# Patient Record
Sex: Male | Born: 1995 | Race: Black or African American | Hispanic: No | Marital: Single | Smoking: Current every day smoker
Health system: Southern US, Community
[De-identification: ages and names within clinical notes are randomized; demographics above are authoritative.]

## PROBLEM LIST (undated history)

## (undated) DIAGNOSIS — F329 Major depressive disorder, single episode, unspecified: Secondary | ICD-10-CM

## (undated) DIAGNOSIS — F32A Depression, unspecified: Secondary | ICD-10-CM

## (undated) HISTORY — PX: CLAVICLE SURGERY: SHX598

---
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
## 2014-07-26 ENCOUNTER — Emergency Department (HOSPITAL_COMMUNITY)
Admission: EM | Admit: 2014-07-26 | Discharge: 2014-07-27 | Disposition: A | Discharge status: Home / Self Care | Payer: Self-pay | Attending: Emergency Medicine | Admitting: Emergency Medicine

## 2014-07-26 ENCOUNTER — Encounter (HOSPITAL_COMMUNITY): Payer: Self-pay

## 2014-07-26 DIAGNOSIS — S60417A Abrasion of left little finger, initial encounter: Secondary | ICD-10-CM | POA: Insufficient documentation

## 2014-07-26 DIAGNOSIS — S0990XA Unspecified injury of head, initial encounter: Secondary | ICD-10-CM | POA: Insufficient documentation

## 2014-07-26 DIAGNOSIS — Y998 Other external cause status: Secondary | ICD-10-CM | POA: Insufficient documentation

## 2014-07-26 DIAGNOSIS — Z792 Long term (current) use of antibiotics: Secondary | ICD-10-CM | POA: Insufficient documentation

## 2014-07-26 DIAGNOSIS — Z880 Allergy status to penicillin: Secondary | ICD-10-CM | POA: Insufficient documentation

## 2014-07-26 DIAGNOSIS — Y9389 Activity, other specified: Secondary | ICD-10-CM | POA: Insufficient documentation

## 2014-07-26 DIAGNOSIS — Y92410 Unspecified street and highway as the place of occurrence of the external cause: Secondary | ICD-10-CM | POA: Insufficient documentation

## 2014-07-26 DIAGNOSIS — S3992XA Unspecified injury of lower back, initial encounter: Secondary | ICD-10-CM | POA: Insufficient documentation

## 2014-07-26 DIAGNOSIS — S301XXA Contusion of abdominal wall, initial encounter: Secondary | ICD-10-CM | POA: Insufficient documentation

## 2014-07-26 DIAGNOSIS — S20319A Abrasion of unspecified front wall of thorax, initial encounter: Secondary | ICD-10-CM | POA: Insufficient documentation

## 2014-07-26 DIAGNOSIS — Z720 Tobacco use: Secondary | ICD-10-CM | POA: Insufficient documentation

## 2014-07-26 DIAGNOSIS — Z79899 Other long term (current) drug therapy: Secondary | ICD-10-CM | POA: Insufficient documentation
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
## 2014-07-27 ENCOUNTER — Encounter (HOSPITAL_COMMUNITY): Payer: Self-pay | Admitting: Radiology

## 2014-07-27 ENCOUNTER — Emergency Department (HOSPITAL_COMMUNITY): Payer: Self-pay

## 2014-07-27 ENCOUNTER — Emergency Department (HOSPITAL_COMMUNITY): Payer: Medicaid Other

## 2014-07-27 LAB — CBC WITH DIFFERENTIAL/PLATELET
BASOS PCT: 0 % (ref 0–1)
Basophils Absolute: 0 10*3/uL (ref 0.0–0.1)
EOS ABS: 0.2 10*3/uL (ref 0.0–0.7)
EOS PCT: 3 % (ref 0–5)
HCT: 42.3 % (ref 39.0–52.0)
Hemoglobin: 14.4 g/dL (ref 13.0–17.0)
Lymphocytes Relative: 44 % (ref 12–46)
Lymphs Abs: 2.7 10*3/uL (ref 0.7–4.0)
MCH: 30.3 pg (ref 26.0–34.0)
MCHC: 34 g/dL (ref 30.0–36.0)
MCV: 89.1 fL (ref 78.0–100.0)
MONO ABS: 0.5 10*3/uL (ref 0.1–1.0)
Monocytes Relative: 8 % (ref 3–12)
Neutro Abs: 2.8 10*3/uL (ref 1.7–7.7)
Neutrophils Relative %: 45 % (ref 43–77)
PLATELETS: 190 10*3/uL (ref 150–400)
RBC: 4.75 MIL/uL (ref 4.22–5.81)
RDW: 13.5 % (ref 11.5–15.5)
WBC: 6.1 10*3/uL (ref 4.0–10.5)

## 2014-07-27 LAB — COMPREHENSIVE METABOLIC PANEL
ALBUMIN: 4 g/dL (ref 3.5–5.2)
ALT: 20 U/L (ref 0–53)
ANION GAP: 7 (ref 5–15)
AST: 23 U/L (ref 0–37)
Alkaline Phosphatase: 71 U/L (ref 39–117)
BUN: 17 mg/dL (ref 6–23)
CHLORIDE: 104 mmol/L (ref 96–112)
CO2: 27 mmol/L (ref 19–32)
CREATININE: 1.37 mg/dL — AB (ref 0.50–1.35)
Calcium: 9 mg/dL (ref 8.4–10.5)
GFR, EST AFRICAN AMERICAN: 85 mL/min — AB (ref >90)
GFR, EST NON AFRICAN AMERICAN: 73 mL/min — AB (ref >90)
Glucose, Bld: 97 mg/dL (ref 70–99)
POTASSIUM: 3.8 mmol/L (ref 3.5–5.1)
Sodium: 138 mmol/L (ref 135–145)
TOTAL PROTEIN: 6.2 g/dL (ref 6.0–8.3)
Total Bilirubin: 1.3 mg/dL — ABNORMAL HIGH (ref 0.3–1.2)

## 2014-07-27 LAB — CK: CK TOTAL: 511 U/L — AB (ref 7–232)

## 2014-07-27 LAB — LIPASE, BLOOD: Lipase: 36 U/L (ref 11–59)

## 2014-07-27 LAB — I-STAT CREATININE, ED: CREATININE: 1.4 mg/dL — AB (ref 0.50–1.35)

## 2014-07-27 LAB — TROPONIN I: Troponin I: <0.03 ng/mL (ref <0.031)

## 2014-07-27 MED ORDER — SODIUM CHLORIDE 0.9 % IV BOLUS (SEPSIS)
1000.0000 mL | Freq: Once | INTRAVENOUS | Status: AC
  Administered 2014-07-27: 1000 mL via INTRAVENOUS

## 2014-07-27 MED ORDER — NAPROXEN 500 MG PO TABS: ORAL_TABLET | ORAL | Status: AC

## 2014-07-27 MED ORDER — FENTANYL CITRATE 0.05 MG/ML IJ SOLN
50.0000 ug | Freq: Once | INTRAMUSCULAR | Status: AC
  Administered 2014-07-27: 50 ug via INTRAVENOUS
  Filled 2014-07-27: qty 2

## 2014-07-27 MED ORDER — IOHEXOL 300 MG/ML  SOLN
100.0000 mL | Freq: Once | INTRAMUSCULAR | Status: AC | PRN
  Administered 2014-07-27: 100 mL via INTRAVENOUS

## 2014-07-27 MED ORDER — SODIUM CHLORIDE 0.9 % IV SOLN
INTRAVENOUS | Status: DC
  Administered 2014-07-27: 02:00:00 via INTRAVENOUS

## 2014-07-27 MED ORDER — CYCLOBENZAPRINE HCL 5 MG PO TABS: 5.0000 mg | ORAL_TABLET | Freq: Three times a day (TID) | ORAL | Status: AC | PRN
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
## 2014-07-27 NOTE — ED Notes (Signed)
Pt is aware urine is needed for testing, urinal at bedside. 
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
## 2017-08-26 ENCOUNTER — Emergency Department (HOSPITAL_COMMUNITY)
Admission: EM | Admit: 2017-08-26 | Discharge: 2017-08-26 | Disposition: A | Discharge status: Home / Self Care | Payer: Self-pay

## 2019-05-04 ENCOUNTER — Emergency Department (HOSPITAL_COMMUNITY)
Admission: EM | Admit: 2019-05-04 | Discharge: 2019-05-04 | Disposition: A | Discharge status: Home / Self Care | Payer: Self-pay | Attending: Emergency Medicine | Admitting: Emergency Medicine

## 2019-05-04 ENCOUNTER — Encounter (HOSPITAL_COMMUNITY): Payer: Self-pay | Admitting: Emergency Medicine

## 2019-05-04 ENCOUNTER — Other Ambulatory Visit: Payer: Self-pay

## 2019-05-04 DIAGNOSIS — F17210 Nicotine dependence, cigarettes, uncomplicated: Secondary | ICD-10-CM | POA: Insufficient documentation

## 2019-05-04 DIAGNOSIS — H109 Unspecified conjunctivitis: Secondary | ICD-10-CM | POA: Insufficient documentation

## 2019-05-04 MED ORDER — CIPROFLOXACIN HCL 0.3 % OP SOLN: 2.0000 [drp] | OPHTHALMIC | 0 refills | Status: AC

## 2019-05-04 MED ORDER — FLUORESCEIN SODIUM 1 MG OP STRP
1.0000 | ORAL_STRIP | Freq: Once | OPHTHALMIC | Status: AC
  Administered 2019-05-04: 13:00:00 1 via OPHTHALMIC
  Filled 2019-05-04: qty 1

## 2019-05-04 MED ORDER — TETRACAINE HCL 0.5 % OP SOLN
2.0000 [drp] | Freq: Once | OPHTHALMIC | Status: AC
  Administered 2019-05-04: 13:00:00 2 [drp] via OPHTHALMIC
  Filled 2019-05-04: qty 4

## 2019-05-04 NOTE — Discharge Instructions (Signed)
Bacterial conjunctivitis is highly contagious and spread by direct contact with secretions or contact with contaminated objects. Infected individuals should not share handkerchiefs, tissues, towels, cosmetics, linens, or eating utensils.

## 2019-05-04 NOTE — ED Triage Notes (Signed)
Pt in with R eye pain and redness x 5 days. States he and his son were treated for conjunctivitis, used the eye drops for trx. His son's eye got better, pt's did not. States pain, itching and redness worse. States some blurred vision in the eye

## 2019-05-04 NOTE — ED Provider Notes (Addendum)
[facility]   CSN: [id number] Arrival date & time: 05/04/19  1045     History Chief Complaint  Patient presents with  . Eye Pain    Alfred Gwenyth BouillonM Orsino Jr. is a 23 y.o. male.  He presents today with complaints of right eye redness and pain. He and his son were both recently treated for bacterial conjunctivits with Polytrim on Thursday, 04/29/2019. He initially felt relief with the eye drops but then noted two days ago (Sunday) that his eye was becoming more inflammed and painful. He endorses continued dried secretions when he wakes up and weeping during the day. He feels like there is some swelling to his right eye and pain when he looks to the right. He denies any injury or trauma to the eye. He has no reported fever and no other complaints. Patient does not wear contact lenses.   The history is provided by the patient. No language interpreter was used.       Past Medical History:  Diagnosis Date  . Depression     There are no problems to display for this patient.   Past Surgical History:  Procedure Laterality Date  . CLAVICLE SURGERY         No family history on file.  Social History   Tobacco Use  . Smoking status: Current Every Day Smoker    Packs/day: 0.50    Types: Cigarettes  . Smokeless tobacco: Never Used  Substance Use Topics  . Alcohol use: Yes    Comment: every other day  . Drug use: Yes    Types: Marijuana    Home Medications Prior to Admission medications   Medication Sig Start Date End Date Taking? Authorizing Provider  ciprofloxacin (CILOXAN) 0.3 % ophthalmic solution Place 2 drops into the right eye every 4 (four) hours while awake for 7 days. 05/04/19 05/11/19  Orma FlamingHouk, Trase Bunda R, NP  cyclobenzaprine (FLEXERIL) 5 MG tablet Take 1 tablet (5 mg total) by mouth 3 (three) times daily as needed (muscle soreness). 07/27/14   Devoria AlbeKnapp, Iva, MD  doxycycline (VIBRAMYCIN) 100 MG capsule Take 1 capsule (100 mg  total) by mouth 2 (two) times daily. 03/01/12   Sunnie Nielsenpitz, Brian, MD  ibuprofen (ADVIL,MOTRIN) 200 MG tablet Take 200 mg by mouth every 8 (eight) hours as needed. For pain    [provider]  ibuprofen (ADVIL,MOTRIN) 800 MG tablet Take 1 tablet (800 mg total) by mouth 3 (three) times daily. 03/01/12   Sunnie Nielsenpitz, Brian, MD  naproxen (NAPROSYN) 500 MG tablet Take 1 po BID with food prn pain 07/27/14   Devoria AlbeKnapp, Iva, MD  Naproxen Sodium (ALEVE PO) Take 1 tablet by mouth 2 (two) times daily as needed. For pain    [provider]  oxyCODONE-acetaminophen (PERCOCET/ROXICET) 5-325 MG per tablet 1 to 2 tabs PO q6hrs  PRN for pain 03/04/12   Pisciotta, Joni ReiningNicole, PA-C    Allergies    Amoxicillin and Penicillins  Review of Systems   Review of Systems  Constitutional: Negative for chills and fever.  HENT: Negative for congestion.   Eyes: Positive for pain, discharge, redness and visual disturbance.  Respiratory: Negative for cough and shortness of breath.   Cardiovascular: Negative for chest pain.  Gastrointestinal: Negative for abdominal pain, nausea and vomiting.  Musculoskeletal: Negative for myalgias.  Skin: Negative for rash and wound.  Allergic/Immunologic: Negative for environmental allergies.  Neurological: Negative for facial asymmetry.  Hematological: Negative for adenopathy.    Physical Exam  Updated Vital Signs BP (!) 131/94   Pulse 100   Temp 99.3 F (37.4 C) (Oral)   Resp 18   Wt 75.8 kg   SpO2 100%   BMI 20.34 kg/m   Physical Exam Constitutional:      Appearance: Normal appearance.  HENT:     Head: Normocephalic and atraumatic.     Nose: Nose normal.     Mouth/Throat:     Mouth: Mucous membranes are moist.     Pharynx: Oropharynx is clear.  Eyes:     General: Vision grossly intact.        Right eye: Discharge present.        Left eye: No discharge.     Extraocular Movements: Extraocular movements intact.     Pupils: Pupils are equal, round, and reactive to  light.     Comments: Right eye with injected conjunctivae, pain in the eye when patient looks to the right. Minor periorbital edema. PERRLA 3 mm bilaterally. EOMs intact, no nystagmus.   Cardiovascular:     Rate and Rhythm: Normal rate and regular rhythm.     Pulses: Normal pulses.  Pulmonary:     Effort: Pulmonary effort is normal.     Breath sounds: Normal breath sounds.  Abdominal:     General: Abdomen is flat. Bowel sounds are normal.     Palpations: Abdomen is soft.  Musculoskeletal:        General: Normal range of motion.     Cervical back: Normal range of motion and neck supple.  Skin:    General: Skin is warm and dry.     Capillary Refill: Capillary refill takes less than 2 seconds.  Neurological:     General: No focal deficit present.     Mental Status: He is alert and oriented to person, place, and time.  Psychiatric:        Mood and Affect: Mood normal.     ED Results / Procedures / Treatments   Labs (all labs ordered are listed, but only abnormal results are displayed) Labs Reviewed - No data to display  EKG None  Radiology No results found.  Procedures Procedures (including critical care time)  Medications Ordered in ED Medications  tetracaine (PONTOCAINE) 0.5 % ophthalmic solution 2 drop (has no administration in time range)  fluorescein ophthalmic strip 1 strip (has no administration in time range)    ED Course  I have reviewed the triage vital signs and the nursing notes.  Pertinent labs & imaging results that were available during my care of the patient were reviewed by me and considered in my medical decision making (see chart for details).    MDM Rules/Calculators/A&P                       With patient reported increased erythema, blurred vision, and pain with EOM's, will plan to stain the eye to rule out corneal abrasion. Will also check the pressure of the patient's eye with tonopen.   If these findings are normal, will plan to change  Ausar's ophthalmic antibiotics to ciprofloxacin 0.3%, 1 to 2 drops QID for 7 days. He can stop using the Trimethoprim-polymyxin ophthalmic drops.   1239: Right eye has no corneal abrasion. Pressure: 14 mmgHG.    Will recommend close outpatient follow up with ophthalmology tomorrow for a recheck of his eye. Information for local opthalmology clinic provided   Return precautions provided and patient verbalized understanding of these precautions.  Final Clinical Impression(s) / ED Diagnoses Final diagnoses:  Bacterial conjunctivitis of right eye    Rx / DC Orders ED Discharge Orders         Ordered    ciprofloxacin (CILOXAN) 0.3 % ophthalmic solution  Every 4 hours while awake     05/04/19 1250           Anthoney Harada, NP 05/04/19 1255    Anthoney Harada, NP 05/04/19 1302    Elnora Morrison, MD 05/04/19 1513

## 2019-05-07 ENCOUNTER — Other Ambulatory Visit: Payer: Self-pay

## 2019-05-07 ENCOUNTER — Emergency Department (HOSPITAL_COMMUNITY)
Admission: EM | Admit: 2019-05-07 | Discharge: 2019-05-07 | Disposition: A | Discharge status: Home / Self Care | Payer: Self-pay | Attending: Emergency Medicine | Admitting: Emergency Medicine

## 2019-05-07 DIAGNOSIS — H579 Unspecified disorder of eye and adnexa: Secondary | ICD-10-CM | POA: Insufficient documentation

## 2019-05-07 DIAGNOSIS — Z5321 Procedure and treatment not carried out due to patient leaving prior to being seen by health care provider: Secondary | ICD-10-CM | POA: Insufficient documentation

## 2019-05-07 DIAGNOSIS — Z113 Encounter for screening for infections with a predominantly sexual mode of transmission: Secondary | ICD-10-CM | POA: Insufficient documentation

## 2019-05-07 NOTE — ED Triage Notes (Signed)
Pt was seen by ER for his right eye and was given an abx that was irritating his eye further. Followed up with opthomologist and was told to stop taking abx and use OTC eye drops. States just wants to make sure his eye is okay. Pt also states "on a different note, the girl I am messing with called me today and said she got trich and gonnorhea". Pt needs to be treated for an STD.

## 2019-05-07 NOTE — ED Notes (Signed)
Pt states he can go the clinic in the morning to be seen.  Pt is leaving

## 2019-05-17 ENCOUNTER — Encounter (HOSPITAL_COMMUNITY): Payer: Self-pay | Admitting: Emergency Medicine

## 2019-05-17 ENCOUNTER — Emergency Department (HOSPITAL_COMMUNITY)
Admission: EM | Admit: 2019-05-17 | Discharge: 2019-05-17 | Disposition: A | Discharge status: Home / Self Care | Payer: Self-pay | Attending: Emergency Medicine | Admitting: Emergency Medicine

## 2019-05-17 ENCOUNTER — Other Ambulatory Visit: Payer: Self-pay

## 2019-05-17 DIAGNOSIS — Z79899 Other long term (current) drug therapy: Secondary | ICD-10-CM | POA: Insufficient documentation

## 2019-05-17 DIAGNOSIS — Z202 Contact with and (suspected) exposure to infections with a predominantly sexual mode of transmission: Secondary | ICD-10-CM | POA: Insufficient documentation

## 2019-05-17 DIAGNOSIS — R369 Urethral discharge, unspecified: Secondary | ICD-10-CM | POA: Insufficient documentation

## 2019-05-17 DIAGNOSIS — F17210 Nicotine dependence, cigarettes, uncomplicated: Secondary | ICD-10-CM | POA: Insufficient documentation

## 2019-05-17 MED ORDER — AZITHROMYCIN 250 MG PO TABS
1000.0000 mg | ORAL_TABLET | Freq: Once | ORAL | Status: AC
  Administered 2019-05-17: 1000 mg via ORAL
  Filled 2019-05-17: qty 4

## 2019-05-17 MED ORDER — METRONIDAZOLE 500 MG PO TABS
2000.0000 mg | ORAL_TABLET | Freq: Once | ORAL | Status: AC
  Administered 2019-05-17: 2000 mg via ORAL
  Filled 2019-05-17: qty 4

## 2019-05-17 MED ORDER — LIDOCAINE HCL (PF) 1 % IJ SOLN
INTRAMUSCULAR | Status: AC
  Administered 2019-05-17: 0.9 mL
  Filled 2019-05-17: qty 5

## 2019-05-17 MED ORDER — CEFTRIAXONE SODIUM 250 MG IJ SOLR
250.0000 mg | Freq: Once | INTRAMUSCULAR | Status: AC
  Administered 2019-05-17: 250 mg via INTRAMUSCULAR
  Filled 2019-05-17: qty 250

## 2019-05-17 NOTE — Discharge Instructions (Addendum)
Follow-up with the health department for HIV and syphilis testing. No sexual intercourse for 10 days. Inform any other partners that they need to be evaluated and treated.

## 2019-05-17 NOTE — ED Triage Notes (Signed)
Pt requesting to be treated for STD, states partner tested positive for gonorrhea and trich.

## 2019-05-17 NOTE — ED Provider Notes (Signed)
[facility] Provider Note   CSN: [id number] Arrival date & time: 05/17/19  0248     History Chief Complaint  Patient presents with  . SEXUALLY TRANSMITTED DISEASE    Alfred Rose. is a 23 y.o. male.  Patient presents for treatment of STDs.  Patient's partner informed him that she tested positive for gonorrhea and trichomonas.  Patient has mild symptoms.        Past Medical History:  Diagnosis Date  . Depression     There are no problems to display for this patient.   Past Surgical History:  Procedure Laterality Date  . CLAVICLE SURGERY         No family history on file.  Social History   Tobacco Use  . Smoking status: Current Every Day Smoker    Packs/day: 0.50    Types: Cigarettes  . Smokeless tobacco: Never Used  Substance Use Topics  . Alcohol use: Yes    Comment: every other day  . Drug use: Yes    Types: Marijuana    Home Medications Prior to Admission medications   Medication Sig Start Date End Date Taking? Authorizing Provider  cyclobenzaprine (FLEXERIL) 5 MG tablet Take 1 tablet (5 mg total) by mouth 3 (three) times daily as needed (muscle soreness). 07/27/14   Rolland Porter, MD  doxycycline (VIBRAMYCIN) 100 MG capsule Take 1 capsule (100 mg total) by mouth 2 (two) times daily. 03/01/12   Teressa Lower, MD  ibuprofen (ADVIL,MOTRIN) 200 MG tablet Take 200 mg by mouth every 8 (eight) hours as needed. For pain    [provider]  ibuprofen (ADVIL,MOTRIN) 800 MG tablet Take 1 tablet (800 mg total) by mouth 3 (three) times daily. 03/01/12   Teressa Lower, MD  naproxen (NAPROSYN) 500 MG tablet Take 1 po BID with food prn pain 07/27/14   Rolland Porter, MD  Naproxen Sodium (ALEVE PO) Take 1 tablet by mouth 2 (two) times daily as needed. For pain    [provider]  oxyCODONE-acetaminophen (PERCOCET/ROXICET) 5-325 MG per tablet 1 to 2 tabs PO q6hrs  PRN for pain 03/04/12   Pisciotta, Elmyra Ricks, PA-C    Allergies    Amoxicillin and Penicillins  Review of Systems   Review of Systems  Constitutional: Negative for fever.  Gastrointestinal: Negative for vomiting.  Genitourinary: Positive for discharge.    Physical Exam Updated Vital Signs BP 118/72 (BP Location: Left Arm)   Pulse 72   Temp 98.7 F (37.1 C) (Oral)   Resp 14   SpO2 97%   Physical Exam Vitals and nursing note reviewed.  HENT:     Head: Normocephalic.  Pulmonary:     Effort: Pulmonary effort is normal.  Abdominal:     General: There is no distension.  Genitourinary:    Comments: Patient has no external lesions or rashes to penis or testicles.  Patient has mild discharge at meatus. Musculoskeletal:     Cervical back: Normal range of motion.  Neurological:     Mental Status: He is alert.     ED Results / Procedures / Treatments   Labs (all labs ordered are listed, but only abnormal results are displayed) Labs Reviewed - No data to display  EKG None  Radiology No results found.  Procedures Procedures (including critical care time)  Medications Ordered in ED Medications  cefTRIAXone (ROCEPHIN) injection 250 mg (has no administration in time range)  azithromycin (ZITHROMAX) tablet 1,000 mg (has no administration in  time range)  metroNIDAZOLE (FLAGYL) tablet 2,000 mg (has no administration in time range)  lidocaine (PF) (XYLOCAINE) 1 % injection (has no administration in time range)    ED Course  I have reviewed the triage vital signs and the nursing notes.  Pertinent labs & imaging results that were available during my care of the patient were reviewed by me and considered in my medical decision making (see chart for details).    MDM Rules/Calculators/A&P                      Patient presents for treatment of STDs.  Patient does not want testing.  Discharge instructions and follow-up discussed. Antibiotics ordered and given in the ER. Final Clinical Impression(s) / ED Diagnoses Final  diagnoses:  STD exposure    Rx / DC Orders ED Discharge Orders    None       Blane Ohara, MD 05/17/19 [phone]

## 2021-02-15 ENCOUNTER — Encounter (HOSPITAL_COMMUNITY): Payer: Self-pay

## 2021-02-15 ENCOUNTER — Other Ambulatory Visit: Payer: Self-pay

## 2021-02-15 ENCOUNTER — Ambulatory Visit (HOSPITAL_COMMUNITY)
Admission: EM | Admit: 2021-02-15 | Discharge: 2021-02-15 | Disposition: A | Discharge status: Home / Self Care | Payer: Self-pay | Attending: Internal Medicine | Admitting: Internal Medicine

## 2021-02-15 DIAGNOSIS — Z202 Contact with and (suspected) exposure to infections with a predominantly sexual mode of transmission: Secondary | ICD-10-CM | POA: Insufficient documentation

## 2021-02-15 DIAGNOSIS — Z880 Allergy status to penicillin: Secondary | ICD-10-CM | POA: Insufficient documentation

## 2021-02-15 DIAGNOSIS — F17210 Nicotine dependence, cigarettes, uncomplicated: Secondary | ICD-10-CM | POA: Insufficient documentation

## 2021-02-15 LAB — HIV ANTIBODY (ROUTINE TESTING W REFLEX): HIV Screen 4th Generation wRfx: NONREACTIVE

## 2021-02-15 MED ORDER — DOXYCYCLINE HYCLATE 100 MG PO CAPS: 100.0000 mg | ORAL_CAPSULE | Freq: Two times a day (BID) | ORAL | 0 refills | Status: AC

## 2021-02-15 MED ORDER — METRONIDAZOLE 500 MG PO TABS: 500.0000 mg | ORAL_TABLET | Freq: Two times a day (BID) | ORAL | 0 refills | Status: AC

## 2021-02-15 NOTE — ED Triage Notes (Signed)
Pt presents for STD testing.   States his recent part tested positive for Chlamydia and Trichomoniasis. Pt c/o stomach pain, denies other sxs.

## 2021-02-15 NOTE — Discharge Instructions (Addendum)
Safe sex practices advised Please take medications as prescribed We will call you with recommendations if labs are abnormal.

## 2021-02-16 LAB — CYTOLOGY, (ORAL, ANAL, URETHRAL) ANCILLARY ONLY
Chlamydia: POSITIVE — AB
Comment: NEGATIVE
Comment: NEGATIVE
Comment: NORMAL
Neisseria Gonorrhea: NEGATIVE
Trichomonas: POSITIVE — AB

## 2021-02-16 LAB — RPR: RPR Ser Ql: NONREACTIVE

## 2021-02-17 NOTE — ED Provider Notes (Signed)
[facility]    CSN: [id number] Arrival date & time: 02/15/21  1337      History   Chief Complaint Chief Complaint  Patient presents with   SEXUALLY TRANSMITTED DISEASE    HPI Alfred Trethewey. is a 25 y.o. male comes to the urgent care for STD testing.  Patient's partner recently tested positive for chlamydia and trichomonas.  Patient has no symptoms.  They have engaging in unprotected sexual intercourse.  No fever or chills.  HPI  Past Medical History:  Diagnosis Date   Depression     There are no problems to display for this patient.   Past Surgical History:  Procedure Laterality Date   CLAVICLE SURGERY         Home Medications    Prior to Admission medications   Medication Sig Start Date End Date Taking? Authorizing Provider  doxycycline (VIBRAMYCIN) 100 MG capsule Take 1 capsule (100 mg total) by mouth 2 (two) times daily for 7 days. 02/15/21 02/22/21 Yes Rhegan Trunnell, Britta Mccreedy, MD  metroNIDAZOLE (FLAGYL) 500 MG tablet Take 1 tablet (500 mg total) by mouth 2 (two) times daily. 02/15/21  Yes Earnest Thalman, Britta Mccreedy, MD  cyclobenzaprine (FLEXERIL) 5 MG tablet Take 1 tablet (5 mg total) by mouth 3 (three) times daily as needed (muscle soreness). 07/27/14   Devoria Albe, MD  ibuprofen (ADVIL,MOTRIN) 200 MG tablet Take 200 mg by mouth every 8 (eight) hours as needed. For pain    [provider]  ibuprofen (ADVIL,MOTRIN) 800 MG tablet Take 1 tablet (800 mg total) by mouth 3 (three) times daily. 03/01/12   Sunnie Nielsen, MD  naproxen (NAPROSYN) 500 MG tablet Take 1 po BID with food prn pain 07/27/14   Devoria Albe, MD  Naproxen Sodium (ALEVE PO) Take 1 tablet by mouth 2 (two) times daily as needed. For pain    [provider]  oxyCODONE-acetaminophen (PERCOCET/ROXICET) 5-325 MG per tablet 1 to 2 tabs PO q6hrs  PRN for pain 03/04/12   Pisciotta, Joni Reining, PA-C    Family History Family History  Problem Relation Age of Onset   Healthy Mother     Social  History Social History   Tobacco Use   Smoking status: Every Day    Packs/day: 0.50    Types: Cigarettes   Smokeless tobacco: Never  Substance Use Topics   Alcohol use: Yes    Comment: every other day   Drug use: Yes    Types: Marijuana     Allergies   Amoxicillin and Penicillins   Review of Systems Review of Systems  Eyes:  Positive for discharge and redness. Negative for photophobia, pain and visual disturbance.    Physical Exam Triage Vital Signs ED Triage Vitals  Enc Vitals Group     BP 02/15/21 1501 126/85     Pulse Rate 02/15/21 1501 60     Resp 02/15/21 1501 19     Temp 02/15/21 1501 98.1 F (36.7 C)     Temp Source 02/15/21 1501 Oral     SpO2 02/15/21 1501 99 %     Weight --      Height --      Head Circumference --      Peak Flow --      Pain Score 02/15/21 1459 0     Pain Loc --      Pain Edu? --      Excl. in GC? --    No data found.  Updated Vital Signs  BP 126/85 (BP Location: Right Arm)   Pulse 60   Temp 98.1 F (36.7 C) (Oral)   Resp 19   SpO2 99%   Visual Acuity Right Eye Distance:   Left Eye Distance:   Bilateral Distance:    Right Eye Near:   Left Eye Near:    Bilateral Near:     Physical Exam Vitals and nursing note reviewed.  Eyes:     Pupils: Pupils are equal, round, and reactive to light.  Cardiovascular:     Rate and Rhythm: Normal rate and regular rhythm.  Abdominal:     General: Bowel sounds are normal.     Palpations: Abdomen is soft.  Genitourinary:    Penis: Normal.      Testes: Normal.  Musculoskeletal:        General: Normal range of motion.     UC Treatments / Results  Labs (all labs ordered are listed, but only abnormal results are displayed) Labs Reviewed  CYTOLOGY, (ORAL, ANAL, URETHRAL) ANCILLARY ONLY - Abnormal; Notable for the following components:      Result Value   Chlamydia Positive (*)    Trichomonas Positive (*)    All other components within normal limits  RPR  HIV ANTIBODY (ROUTINE  TESTING W REFLEX)    EKG   Radiology No results found.  Procedures Procedures (including critical care time)  Medications Ordered in UC Medications - No data to display  Initial Impression / Assessment and Plan / UC Course  I have reviewed the triage vital signs and the nursing notes.  Pertinent labs & imaging results that were available during my care of the patient were reviewed by me and considered in my medical decision making (see chart for details).     1.  Exposure to chlamydia and trichomonas: Doxycycline 100 milligrams twice daily for 7 days Metronidazole 500 mg twice daily for 7 days Cytology for GC/chlamydia/trichomonas HIV/RPR Return to urgent care if you have any concerns We will call you with recommendations if labs are abnormal Safe sex practices advised  Final Clinical Impressions(s) / UC Diagnoses   Final diagnoses:  STD exposure     Discharge Instructions      Safe sex practices advised Please take medications as prescribed We will call you with recommendations if labs are abnormal.   ED Prescriptions     Medication Sig Dispense Auth. Provider   doxycycline (VIBRAMYCIN) 100 MG capsule Take 1 capsule (100 mg total) by mouth 2 (two) times daily for 7 days. 14 capsule Alfred Rose, Britta Mccreedy, MD   metroNIDAZOLE (FLAGYL) 500 MG tablet Take 1 tablet (500 mg total) by mouth 2 (two) times daily. 14 tablet Alfred Rose, Britta Mccreedy, MD      PDMP not reviewed this encounter.   Merrilee Jansky, MD 02/17/21 [phone]

## 2021-02-28 ENCOUNTER — Telehealth (HOSPITAL_COMMUNITY): Payer: Self-pay | Admitting: Emergency Medicine

## 2021-02-28 NOTE — Telephone Encounter (Signed)
Patient received letter and left a voicemail to call him at [phone] This RN called and a male answered.  I asked for her to provide him my number to call.  She asked what it was concerning and I told her I wasn't able to discuss at this time and she told me she would not be sharing the message then and I can keep trying to call to get him when he's home.  I informed her we would not be able to do that, and she ended the call.

## 2022-08-14 ENCOUNTER — Other Ambulatory Visit (HOSPITAL_COMMUNITY): Payer: Self-pay

## 2022-08-14 ENCOUNTER — Emergency Department (HOSPITAL_COMMUNITY)
Admission: EM | Admit: 2022-08-14 | Discharge: 2022-08-15 | Disposition: A | Discharge status: Home / Self Care | Payer: Self-pay | Attending: Emergency Medicine | Admitting: Emergency Medicine

## 2022-08-14 ENCOUNTER — Emergency Department (HOSPITAL_COMMUNITY): Payer: Self-pay

## 2022-08-14 DIAGNOSIS — S40811A Abrasion of right upper arm, initial encounter: Secondary | ICD-10-CM | POA: Insufficient documentation

## 2022-08-14 DIAGNOSIS — Z23 Encounter for immunization: Secondary | ICD-10-CM | POA: Insufficient documentation

## 2022-08-14 DIAGNOSIS — F10920 Alcohol use, unspecified with intoxication, uncomplicated: Secondary | ICD-10-CM

## 2022-08-14 DIAGNOSIS — Y907 Blood alcohol level of 200-239 mg/100 ml: Secondary | ICD-10-CM | POA: Insufficient documentation

## 2022-08-14 DIAGNOSIS — S60511A Abrasion of right hand, initial encounter: Secondary | ICD-10-CM | POA: Insufficient documentation

## 2022-08-14 DIAGNOSIS — R7989 Other specified abnormal findings of blood chemistry: Secondary | ICD-10-CM | POA: Insufficient documentation

## 2022-08-14 DIAGNOSIS — S60519A Abrasion of unspecified hand, initial encounter: Secondary | ICD-10-CM

## 2022-08-14 DIAGNOSIS — S0081XA Abrasion of other part of head, initial encounter: Secondary | ICD-10-CM | POA: Insufficient documentation

## 2022-08-14 DIAGNOSIS — S31119A Laceration without foreign body of abdominal wall, unspecified quadrant without penetration into peritoneal cavity, initial encounter: Secondary | ICD-10-CM | POA: Insufficient documentation

## 2022-08-14 DIAGNOSIS — X780XXA Intentional self-harm by sharp glass, initial encounter: Secondary | ICD-10-CM | POA: Insufficient documentation

## 2022-08-14 DIAGNOSIS — F1210 Cannabis abuse, uncomplicated: Secondary | ICD-10-CM | POA: Insufficient documentation

## 2022-08-14 DIAGNOSIS — R451 Restlessness and agitation: Secondary | ICD-10-CM | POA: Insufficient documentation

## 2022-08-14 DIAGNOSIS — R519 Headache, unspecified: Secondary | ICD-10-CM | POA: Insufficient documentation

## 2022-08-14 DIAGNOSIS — S80812A Abrasion, left lower leg, initial encounter: Secondary | ICD-10-CM | POA: Insufficient documentation

## 2022-08-14 DIAGNOSIS — F10120 Alcohol abuse with intoxication, uncomplicated: Secondary | ICD-10-CM | POA: Insufficient documentation

## 2022-08-14 LAB — CBG MONITORING, ED: Glucose-Capillary: 98 mg/dL (ref 70–99)

## 2022-08-14 MED ORDER — KETAMINE HCL 50 MG/ML IJ SOLN
200.0000 mg | Freq: Once | INTRAMUSCULAR | Status: AC
  Administered 2022-08-14: 200 mg via INTRAMUSCULAR
  Filled 2022-08-14: qty 10

## 2022-08-14 MED ORDER — DIPHENHYDRAMINE HCL 50 MG/ML IJ SOLN
INTRAMUSCULAR | Status: AC
  Administered 2022-08-14: 50 mg via INTRAMUSCULAR
  Filled 2022-08-14: qty 1

## 2022-08-14 MED ORDER — LORAZEPAM 2 MG/ML PO CONC: 2.0000 mg | Freq: Once | ORAL | Status: DC

## 2022-08-14 MED ORDER — LORAZEPAM 2 MG/ML IJ SOLN
INTRAMUSCULAR | Status: AC
  Administered 2022-08-14: 2 mg via INTRAMUSCULAR
  Filled 2022-08-14: qty 1

## 2022-08-14 MED ORDER — HALOPERIDOL LACTATE 5 MG/ML IJ SOLN
INTRAMUSCULAR | Status: AC
  Administered 2022-08-14: 5 mg via INTRAMUSCULAR
  Filled 2022-08-14: qty 1

## 2022-08-14 MED ORDER — LORAZEPAM 2 MG/ML IJ SOLN: 2.0000 mg | Freq: Once | INTRAMUSCULAR | Status: DC

## 2022-08-14 MED ORDER — LORAZEPAM 2 MG/ML IJ SOLN
2.0000 mg | Freq: Once | INTRAMUSCULAR | Status: AC
  Filled 2022-08-14: qty 1

## 2022-08-14 MED ORDER — DIPHENHYDRAMINE HCL 50 MG/ML IJ SOLN: 50.0000 mg | Freq: Once | INTRAMUSCULAR | Status: AC

## 2022-08-14 MED ORDER — HALOPERIDOL LACTATE 5 MG/ML IJ SOLN: 5.0000 mg | Freq: Once | INTRAMUSCULAR | Status: AC

## 2022-08-14 MED ORDER — TETANUS-DIPHTH-ACELL PERTUSSIS 5-2.5-18.5 LF-MCG/0.5 IM SUSY
0.5000 mL | PREFILLED_SYRINGE | Freq: Once | INTRAMUSCULAR | Status: AC
  Administered 2022-08-15: 0.5 mL via INTRAMUSCULAR
  Filled 2022-08-14: qty 0.5
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
## 2022-08-15 ENCOUNTER — Emergency Department (HOSPITAL_COMMUNITY): Payer: Self-pay

## 2022-08-15 ENCOUNTER — Other Ambulatory Visit: Payer: Self-pay

## 2022-08-15 LAB — COMPREHENSIVE METABOLIC PANEL
ALT: 19 U/L (ref 0–44)
AST: 44 U/L — ABNORMAL HIGH (ref 15–41)
Albumin: 4.3 g/dL (ref 3.5–5.0)
Alkaline Phosphatase: 59 U/L (ref 38–126)
Anion gap: 18 — ABNORMAL HIGH (ref 5–15)
BUN: 9 mg/dL (ref 6–20)
CO2: 14 mmol/L — ABNORMAL LOW (ref 22–32)
Calcium: 8.4 mg/dL — ABNORMAL LOW (ref 8.9–10.3)
Chloride: 104 mmol/L (ref 98–111)
Creatinine, Ser: 1.09 mg/dL (ref 0.61–1.24)
GFR, Estimated: >60 mL/min (ref >60)
Glucose, Bld: 90 mg/dL (ref 70–99)
Potassium: 4.7 mmol/L (ref 3.5–5.1)
Sodium: 136 mmol/L (ref 135–145)
Total Bilirubin: 1.1 mg/dL (ref 0.3–1.2)
Total Protein: 6.7 g/dL (ref 6.5–8.1)

## 2022-08-15 LAB — ACETAMINOPHEN LEVEL: Acetaminophen (Tylenol), Serum: 10 ug/mL (ref 10–30)

## 2022-08-15 LAB — CBC WITH DIFFERENTIAL/PLATELET
Abs Immature Granulocytes: 0.03 10*3/uL (ref 0.00–0.07)
Basophils Absolute: 0 10*3/uL (ref 0.0–0.1)
Basophils Relative: 1 %
Eosinophils Absolute: 0.1 10*3/uL (ref 0.0–0.5)
Eosinophils Relative: 2 %
HCT: 46.2 % (ref 39.0–52.0)
Hemoglobin: 15.7 g/dL (ref 13.0–17.0)
Immature Granulocytes: 1 %
Lymphocytes Relative: 33 %
Lymphs Abs: 1.9 10*3/uL (ref 0.7–4.0)
MCH: 31.5 pg (ref 26.0–34.0)
MCHC: 34 g/dL (ref 30.0–36.0)
MCV: 92.8 fL (ref 80.0–100.0)
Monocytes Absolute: 0.3 10*3/uL (ref 0.1–1.0)
Monocytes Relative: 6 %
Neutro Abs: 3.4 10*3/uL (ref 1.7–7.7)
Neutrophils Relative %: 57 %
Platelets: 242 10*3/uL (ref 150–400)
RBC: 4.98 MIL/uL (ref 4.22–5.81)
RDW: 13.4 % (ref 11.5–15.5)
WBC: 5.8 10*3/uL (ref 4.0–10.5)
nRBC: 0 % (ref 0.0–0.2)

## 2022-08-15 LAB — RAPID URINE DRUG SCREEN, HOSP PERFORMED
Amphetamines: NOT DETECTED
Barbiturates: NOT DETECTED
Benzodiazepines: NOT DETECTED
Cocaine: NOT DETECTED
Opiates: NOT DETECTED
Tetrahydrocannabinol: POSITIVE — AB

## 2022-08-15 LAB — SALICYLATE LEVEL: Salicylate Lvl: <7 mg/dL — ABNORMAL LOW (ref 7.0–30.0)

## 2022-08-15 LAB — ETHANOL: Alcohol, Ethyl (B): 218 mg/dL — ABNORMAL HIGH (ref <10)

## 2022-08-15 LAB — CK: Total CK: 928 U/L — ABNORMAL HIGH (ref 49–397)

## 2022-08-15 MED ORDER — LACTATED RINGERS IV BOLUS
1000.0000 mL | Freq: Once | INTRAVENOUS | Status: AC
  Administered 2022-08-15: 1000 mL via INTRAVENOUS
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
## 2022-08-15 NOTE — ED Notes (Signed)
Pt resting with eyes closed; respirations spontaneous, even, unlabored; sitter at bedside  

## 2022-08-15 NOTE — BH Assessment (Signed)
TTS clinician attempted to complete TTS assessment. Arnett, RN, patient medicated, somnolent and unable to arouse. Patient will be seen by day shift provider.

## 2022-08-15 NOTE — Discharge Instructions (Signed)
We evaluated you for your hand abrasions.  You were very intoxicated so we had to give you medication to calm you down.  We did not see any wounds that need to be repaired.  We updated your tetanus status.  Please follow-up with your primary doctor.  Please return if you have any worsening symptoms.

## 2022-08-15 NOTE — Progress Notes (Signed)
Orthopedic Tech Progress Note Patient Details:  [name] Sep 09, 1995 [id number]  Patient ID: [name]., male   DOB: 12-11-1995, 27 y.o.   MRN: [id number] Clicked in wrong chart by mistake. Vernona Rieger 08/15/2022, 4:03 AM

## 2022-08-15 NOTE — ED Notes (Signed)
Sitter at bedside.

## 2022-08-15 NOTE — ED Notes (Signed)
Able to tolerate 2 cups of juice and able to ambulate on a steady gait. Pt is alert and oriented x 4.
